# Patient Record
Sex: Male | Born: 1975 | Race: Black or African American | Hispanic: No | Marital: Married | Smoking: Never smoker
Health system: Southern US, Community
[De-identification: ages and names within clinical notes are randomized; demographics above are authoritative.]

---

## 2013-12-28 DIAGNOSIS — R079 Chest pain, unspecified: Secondary | ICD-10-CM | POA: Insufficient documentation

## 2013-12-28 DIAGNOSIS — R072 Precordial pain: Secondary | ICD-10-CM | POA: Insufficient documentation

## 2013-12-28 DIAGNOSIS — R52 Pain, unspecified: Secondary | ICD-10-CM | POA: Insufficient documentation

## 2013-12-29 ENCOUNTER — Encounter (HOSPITAL_BASED_OUTPATIENT_CLINIC_OR_DEPARTMENT_OTHER): Payer: Self-pay | Admitting: Emergency Medicine

## 2013-12-29 ENCOUNTER — Emergency Department (HOSPITAL_BASED_OUTPATIENT_CLINIC_OR_DEPARTMENT_OTHER)
Admission: EM | Admit: 2013-12-29 | Discharge: 2013-12-29 | Disposition: A | Discharge status: Home / Self Care | Payer: BC Managed Care – PPO | Attending: Emergency Medicine | Admitting: Emergency Medicine

## 2013-12-29 ENCOUNTER — Emergency Department (HOSPITAL_BASED_OUTPATIENT_CLINIC_OR_DEPARTMENT_OTHER): Payer: BC Managed Care – PPO

## 2013-12-29 DIAGNOSIS — R0789 Other chest pain: Secondary | ICD-10-CM

## 2013-12-29 LAB — CBC WITH DIFFERENTIAL/PLATELET
BASOS ABS: 0 10*3/uL (ref 0.0–0.1)
BASOS PCT: 0 % (ref 0–1)
Eosinophils Absolute: 0.1 10*3/uL (ref 0.0–0.7)
Eosinophils Relative: 1 % (ref 0–5)
HCT: 41.9 % (ref 39.0–52.0)
Hemoglobin: 14.7 g/dL (ref 13.0–17.0)
Lymphocytes Relative: 41 % (ref 12–46)
Lymphs Abs: 2.5 10*3/uL (ref 0.7–4.0)
MCH: 30.9 pg (ref 26.0–34.0)
MCHC: 35.1 g/dL (ref 30.0–36.0)
MCV: 88.2 fL (ref 78.0–100.0)
MONO ABS: 0.4 10*3/uL (ref 0.1–1.0)
MONOS PCT: 7 % (ref 3–12)
NEUTROS ABS: 3.1 10*3/uL (ref 1.7–7.7)
NEUTROS PCT: 51 % (ref 43–77)
Platelets: 163 10*3/uL (ref 150–400)
RBC: 4.75 MIL/uL (ref 4.22–5.81)
RDW: 12.3 % (ref 11.5–15.5)
WBC: 6 10*3/uL (ref 4.0–10.5)

## 2013-12-29 LAB — BASIC METABOLIC PANEL
Anion gap: 13 (ref 5–15)
BUN: 15 mg/dL (ref 6–23)
CALCIUM: 10 mg/dL (ref 8.4–10.5)
CO2: 26 mEq/L (ref 19–32)
Chloride: 101 mEq/L (ref 96–112)
Creatinine, Ser: 1.2 mg/dL (ref 0.50–1.35)
GFR calc non Af Amer: 74 mL/min — ABNORMAL LOW (ref >90)
GFR, EST AFRICAN AMERICAN: 86 mL/min — AB (ref >90)
GLUCOSE: 159 mg/dL — AB (ref 70–99)
POTASSIUM: 3.4 meq/L — AB (ref 3.7–5.3)
SODIUM: 140 meq/L (ref 137–147)

## 2013-12-29 LAB — URINALYSIS, ROUTINE W REFLEX MICROSCOPIC
BILIRUBIN URINE: NEGATIVE
GLUCOSE, UA: NEGATIVE mg/dL
Hgb urine dipstick: NEGATIVE
KETONES UR: NEGATIVE mg/dL
Leukocytes, UA: NEGATIVE
Nitrite: NEGATIVE
PH: 7 (ref 5.0–8.0)
PROTEIN: NEGATIVE mg/dL
Specific Gravity, Urine: 1.011 (ref 1.005–1.030)
Urobilinogen, UA: 1 mg/dL (ref 0.0–1.0)

## 2013-12-29 LAB — LIPASE, BLOOD: Lipase: 49 U/L (ref 11–59)

## 2013-12-29 LAB — D-DIMER, QUANTITATIVE: D-Dimer, Quant: <0.27 ug/mL-FEU (ref 0.00–0.48)

## 2013-12-29 NOTE — Discharge Instructions (Signed)

## 2013-12-29 NOTE — ED Notes (Signed)
Left shoulder pain x 2 days,  luq abd pain x 1 day, denies n/v/d  Denies inj

## 2013-12-29 NOTE — ED Provider Notes (Signed)
CSN: [id number]     Arrival date & time 12/28/13  2353 History   First MD Initiated Contact with Patient 12/29/13 0115     Chief Complaint  Patient presents with  . Chest Pain     (Consider location/radiation/quality/duration/timing/severity/associated sxs/prior Treatment) HPI This is a healthy 38 year old male who developed the sudden onset of left lower chest pain late yesterday evening. The pain is mild to moderate. It is dull. It radiates to his left shoulder. It is worse with ambulation, movement or deep breathing. He denies being short of breath. He denies nausea, vomiting or diarrhea. He has had some constipation recently but last moved his bowels yesterday morning. He denies injury to his chest. He has had no recent travel. He denies pain or swelling in his legs.  History reviewed. No pertinent past medical history. History reviewed. No pertinent past surgical history. No family history on file. History  Substance Use Topics  . Smoking status: Never Smoker   . Smokeless tobacco: Not on file  . Alcohol Use: No    Review of Systems  All other systems reviewed and are negative.   Allergies  Review of patient's allergies indicates no known allergies.  Home Medications   Prior to Admission medications   Not on File   BP 133/94  Pulse 72  Temp(Src) 98 F (36.7 C) (Oral)  Resp 16  Ht 5\' 4"  (1.626 m)  Wt 170 lb (77.111 kg)  BMI 29.17 kg/m2  SpO2 97%  Physical Exam General: Well-developed, well-nourished male in no acute distress; appearance consistent with age of record HENT: normocephalic; atraumatic Eyes: pupils equal, round and reactive to light; extraocular muscles intact Neck: supple Heart: regular rate and rhythm Lungs: clear to auscultation bilaterally Chest: Left lower rib tenderness without deformity or crepitus; no rash seen, no hyperesthesia Abdomen: soft; nondistended; nontender; no masses or hepatosplenomegaly; bowel sounds present Extremities: No  deformity; full range of motion; pulses normal; no edema; no calf tenderness Neurologic: Awake, alert and oriented; motor function intact in all extremities and symmetric; no facial droop Skin: Warm and dry Psychiatric: Normal mood and affect    ED Course  Procedures (including critical care time)   MDM    EKG Interpretation  Date/Time:  Saturday December 29 2013 00:16:02 EDT Ventricular Rate:  72 PR Interval:  184 QRS Duration: 82 QT Interval:  346 QTC Calculation: 378 R Axis:   81 Text Interpretation:  Normal sinus rhythm Normal ECG No previous ECGs available Confirmed by Memori Sammon  MD, Jonny Ruiz ([id number]) on 12/29/2013 1:16:23 AM       Nursing notes and vitals signs, including pulse oximetry, reviewed.  Summary of this visit's results, reviewed by myself:  Labs:  Results for orders placed during the hospital encounter of 12/29/13 (from the past 24 hour(s))  CBC WITH DIFFERENTIAL     Status: None   Collection Time    12/29/13 12:25 AM      Result Value Ref Range   WBC 6.0  4.0 - 10.5 K/uL   RBC 4.75  4.22 - 5.81 MIL/uL   Hemoglobin 14.7  13.0 - 17.0 g/dL   HCT 19.1  47.8 - 29.5 %   MCV 88.2  78.0 - 100.0 fL   MCH 30.9  26.0 - 34.0 pg   MCHC 35.1  30.0 - 36.0 g/dL   RDW 62.1  30.8 - 65.7 %   Platelets 163  150 - 400 K/uL   Neutrophils Relative % 51  43 - 77 %  Neutro Abs 3.1  1.7 - 7.7 K/uL   Lymphocytes Relative 41  12 - 46 %   Lymphs Abs 2.5  0.7 - 4.0 K/uL   Monocytes Relative 7  3 - 12 %   Monocytes Absolute 0.4  0.1 - 1.0 K/uL   Eosinophils Relative 1  0 - 5 %   Eosinophils Absolute 0.1  0.0 - 0.7 K/uL   Basophils Relative 0  0 - 1 %   Basophils Absolute 0.0  0.0 - 0.1 K/uL  LIPASE, BLOOD     Status: None   Collection Time    12/29/13 12:25 AM      Result Value Ref Range   Lipase 49  11 - 59 U/L  BASIC METABOLIC PANEL     Status: Abnormal   Collection Time    12/29/13 12:25 AM      Result Value Ref Range   Sodium 140  137 - 147 mEq/L   Potassium 3.4 (*) 3.7  - 5.3 mEq/L   Chloride 101  96 - 112 mEq/L   CO2 26  19 - 32 mEq/L   Glucose, Bld 159 (*) 70 - 99 mg/dL   BUN 15  6 - 23 mg/dL   Creatinine, Ser 1.611.20  0.50 - 1.35 mg/dL   Calcium 09.610.0  8.4 - 04.510.5 mg/dL   GFR calc non Af Amer 74 (*) >90 mL/min   GFR calc Af Amer 86 (*) >90 mL/min   Anion gap 13  5 - 15  D-DIMER, QUANTITATIVE     Status: None   Collection Time    12/29/13  1:24 AM      Result Value Ref Range   D-Dimer, Quant <0.27  0.00 - 0.48 ug/mL-FEU  URINALYSIS, ROUTINE W REFLEX MICROSCOPIC     Status: None   Collection Time    12/29/13  1:53 AM      Result Value Ref Range   Color, Urine YELLOW  YELLOW   APPearance CLEAR  CLEAR   Specific Gravity, Urine 1.011  1.005 - 1.030   pH 7.0  5.0 - 8.0   Glucose, UA NEGATIVE  NEGATIVE mg/dL   Hgb urine dipstick NEGATIVE  NEGATIVE   Bilirubin Urine NEGATIVE  NEGATIVE   Ketones, ur NEGATIVE  NEGATIVE mg/dL   Protein, ur NEGATIVE  NEGATIVE mg/dL   Urobilinogen, UA 1.0  0.0 - 1.0 mg/dL   Nitrite NEGATIVE  NEGATIVE   Leukocytes, UA NEGATIVE  NEGATIVE   Imaging Studies: Dg Chest 2 View  12/29/2013   CLINICAL DATA:  Left upper quadrant pain for 2 days with pain radiating down left arm, pain worse with deep inspiration  EXAM: CHEST  2 VIEW  COMPARISON:  None.  FINDINGS: The heart size and mediastinal contours are within normal limits. Both lungs are clear. The visualized skeletal structures are unremarkable.  IMPRESSION: No active cardiopulmonary disease.   Electronically Signed   By: Esperanza Heiraymond  Rubner M.D.   On: 12/29/2013 01:46   PERC negative, normal D-Dimer.        Hanley SeamenJohn L Felicha Frayne, MD 12/29/13 [id number]

## 2013-12-29 NOTE — ED Notes (Signed)
Pt reporting left shoulder pain since yesterday and LUQ pain that started today. LUQ pain worse with deep breath.

## 2015-07-19 IMAGING — CR DG CHEST 2V
2 series · 2 of 2 positions shown · non-contrast
Comparison: None.

CLINICAL DATA: Left upper quadrant pain for 2 days with pain
radiating down left arm, pain worse with deep inspiration

EXAM:
CHEST  2 VIEW

[w chest pa]
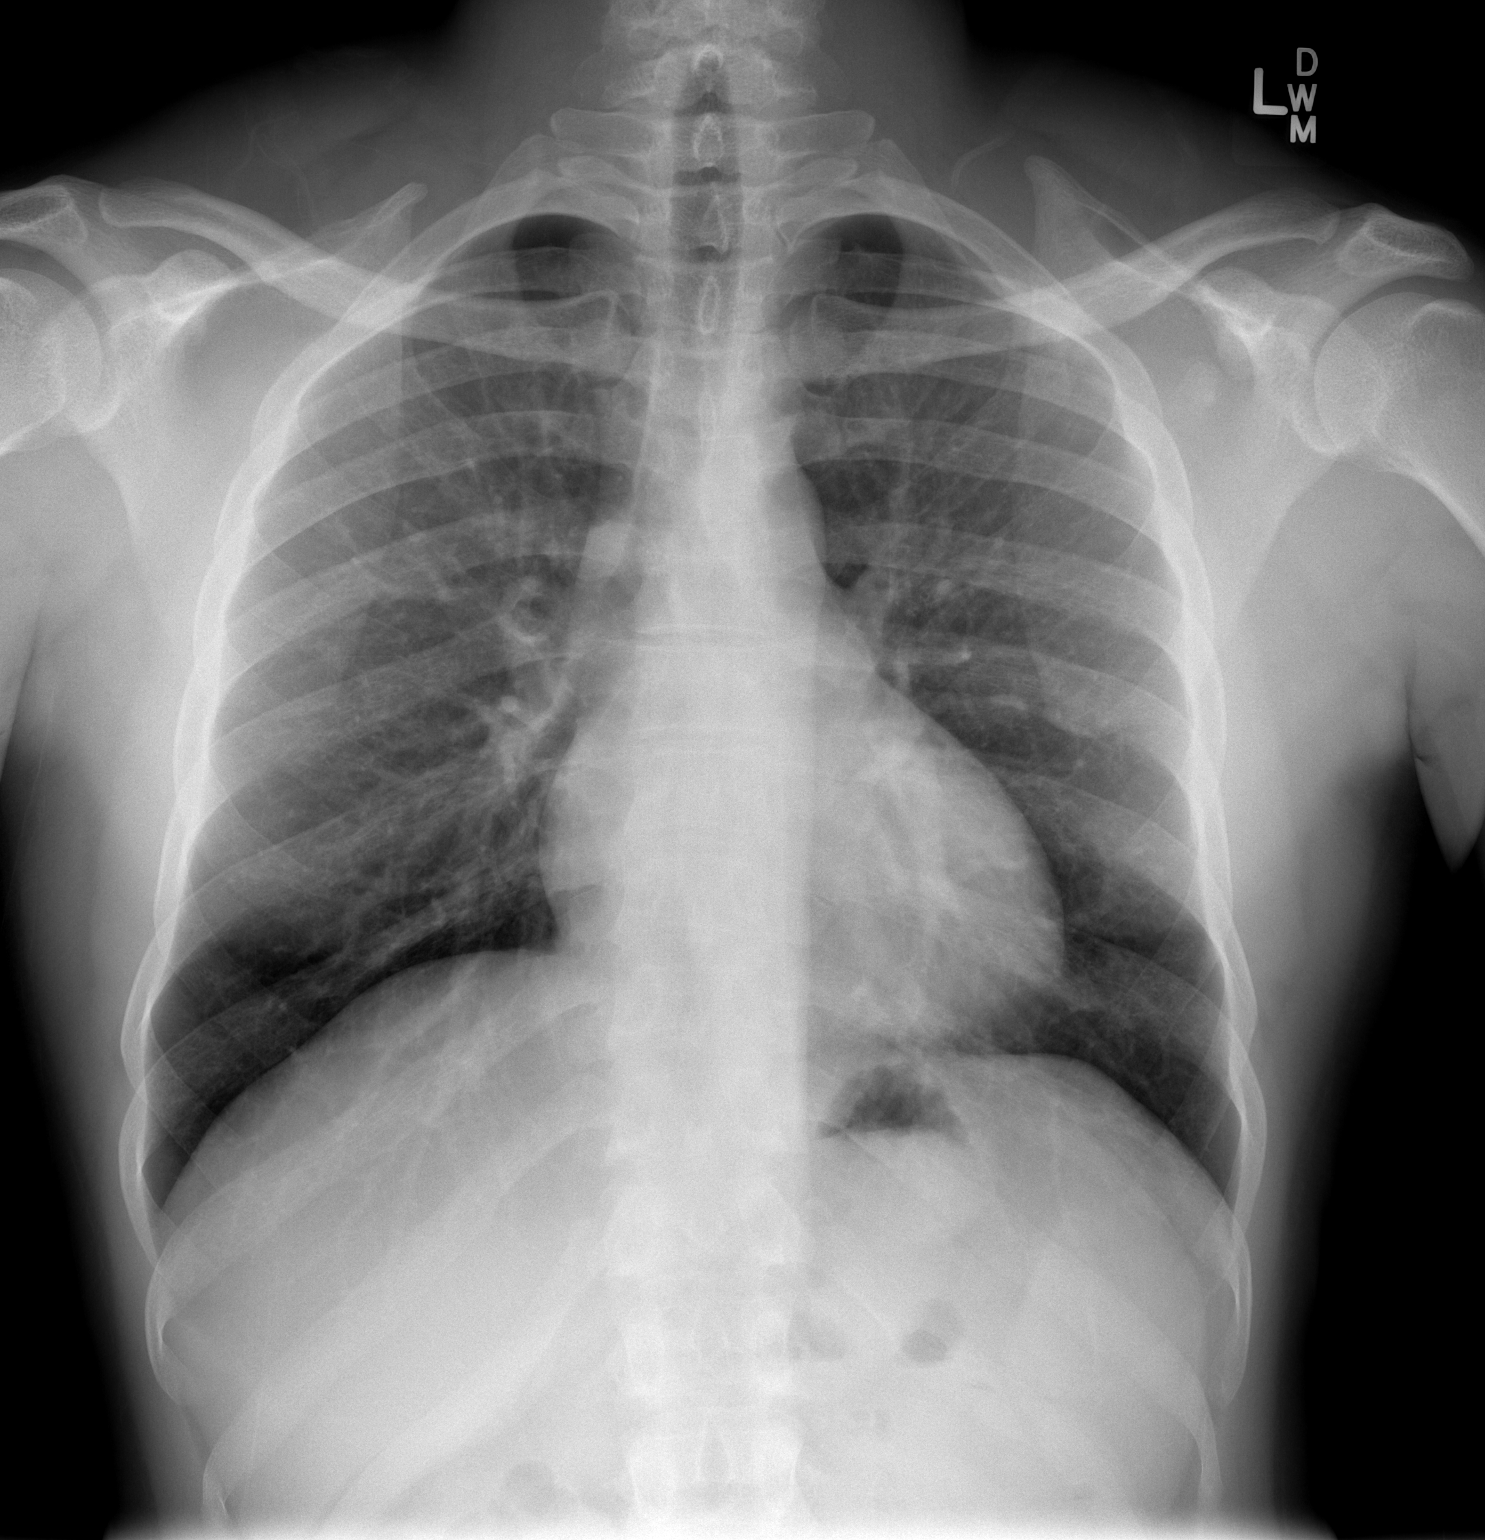

[w chest lat]
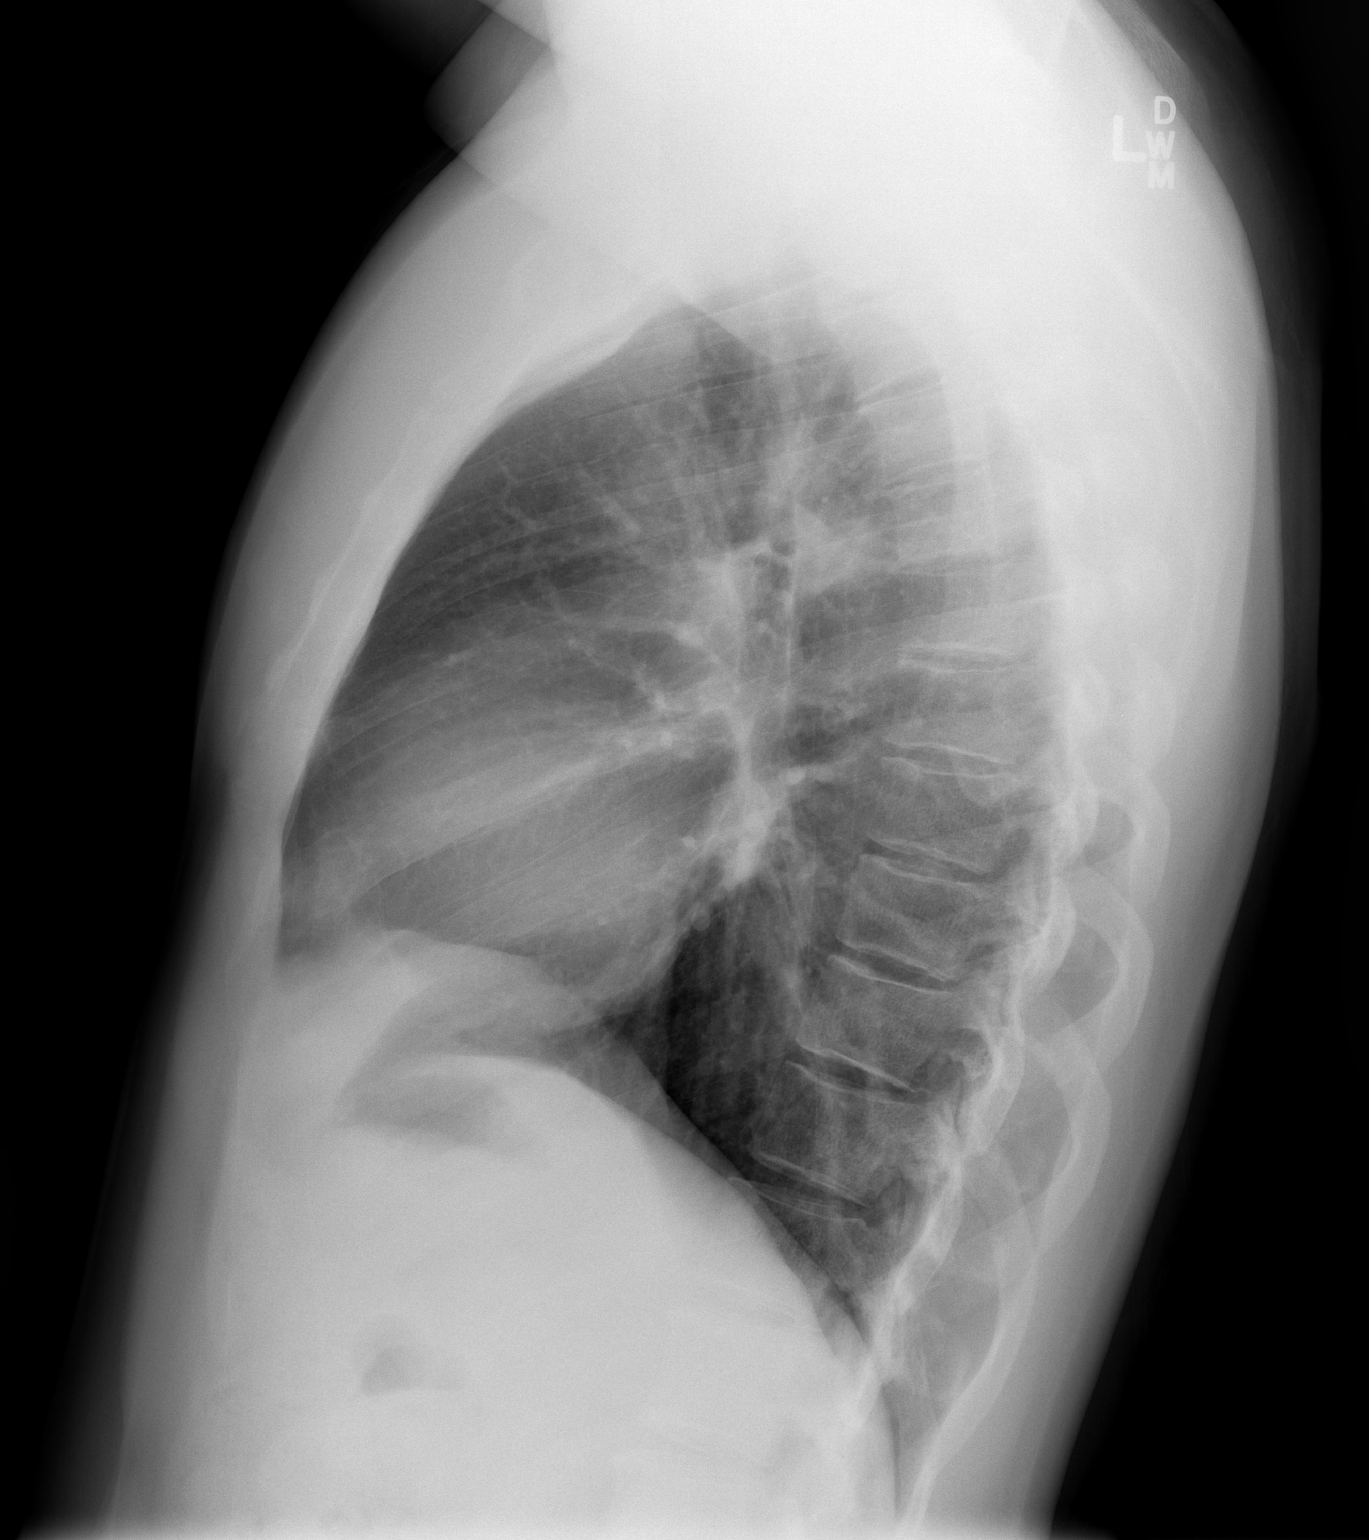

[2 of 2 positions shown; findings below may reference images not displayed]

FINDINGS: The heart size and mediastinal contours are within normal limits.
Both lungs are clear. The visualized skeletal structures are
unremarkable.
IMPRESSION: No active cardiopulmonary disease.
